# Patient Record
Sex: Female | Born: 1995 | Race: White | Hispanic: No | Marital: Single | Smoking: Never smoker
Health system: Southern US, Community
[De-identification: ages and names within clinical notes are randomized; demographics above are authoritative.]

## PROBLEM LIST (undated history)

## (undated) DIAGNOSIS — R109 Unspecified abdominal pain: Secondary | ICD-10-CM

## (undated) DIAGNOSIS — N83209 Unspecified ovarian cyst, unspecified side: Secondary | ICD-10-CM

## (undated) DIAGNOSIS — Q625 Duplication of ureter: Secondary | ICD-10-CM

## (undated) HISTORY — DX: Duplication of ureter: Q62.5

## (undated) HISTORY — PX: URETER SURGERY: SHX823

---
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
## 2017-10-07 ENCOUNTER — Other Ambulatory Visit: Payer: Self-pay

## 2017-10-07 ENCOUNTER — Encounter (HOSPITAL_COMMUNITY): Payer: Self-pay | Admitting: Emergency Medicine

## 2017-10-07 ENCOUNTER — Emergency Department (HOSPITAL_COMMUNITY)
Admission: EM | Admit: 2017-10-07 | Discharge: 2017-10-07 | Disposition: A | Discharge status: Home / Self Care | Payer: BLUE CROSS/BLUE SHIELD | Attending: Emergency Medicine | Admitting: Emergency Medicine

## 2017-10-07 DIAGNOSIS — R1030 Lower abdominal pain, unspecified: Secondary | ICD-10-CM | POA: Insufficient documentation

## 2017-10-07 DIAGNOSIS — R109 Unspecified abdominal pain: Secondary | ICD-10-CM | POA: Diagnosis present

## 2017-10-07 HISTORY — DX: Unspecified ovarian cyst, unspecified side: N83.209

## 2017-10-07 HISTORY — DX: Unspecified abdominal pain: R10.9

## 2017-10-07 LAB — URINALYSIS, ROUTINE W REFLEX MICROSCOPIC
BILIRUBIN URINE: NEGATIVE
Glucose, UA: NEGATIVE mg/dL
Hgb urine dipstick: NEGATIVE
Ketones, ur: NEGATIVE mg/dL
Leukocytes, UA: NEGATIVE
NITRITE: NEGATIVE
Protein, ur: NEGATIVE mg/dL
SPECIFIC GRAVITY, URINE: 1.019 (ref 1.005–1.030)
pH: 7 (ref 5.0–8.0)

## 2017-10-07 LAB — CBC
HCT: 35.8 % — ABNORMAL LOW (ref 36.0–46.0)
HEMOGLOBIN: 12.3 g/dL (ref 12.0–15.0)
MCH: 31.1 pg (ref 26.0–34.0)
MCHC: 34.4 g/dL (ref 30.0–36.0)
MCV: 90.4 fL (ref 78.0–100.0)
Platelets: 130 10*3/uL — ABNORMAL LOW (ref 150–400)
RBC: 3.96 MIL/uL (ref 3.87–5.11)
RDW: 12.6 % (ref 11.5–15.5)
WBC: 7 10*3/uL (ref 4.0–10.5)

## 2017-10-07 LAB — COMPREHENSIVE METABOLIC PANEL
ALK PHOS: 49 U/L (ref 38–126)
ALT: 11 U/L — ABNORMAL LOW (ref 14–54)
ANION GAP: 9 (ref 5–15)
AST: 15 U/L (ref 15–41)
Albumin: 4.1 g/dL (ref 3.5–5.0)
BILIRUBIN TOTAL: 0.2 mg/dL — AB (ref 0.3–1.2)
BUN: 11 mg/dL (ref 6–20)
CALCIUM: 9.1 mg/dL (ref 8.9–10.3)
CO2: 24 mmol/L (ref 22–32)
Chloride: 105 mmol/L (ref 101–111)
Creatinine, Ser: 0.77 mg/dL (ref 0.44–1.00)
GLUCOSE: 96 mg/dL (ref 65–99)
Potassium: 3.6 mmol/L (ref 3.5–5.1)
Sodium: 138 mmol/L (ref 135–145)
TOTAL PROTEIN: 6.4 g/dL — AB (ref 6.5–8.1)

## 2017-10-07 LAB — I-STAT BETA HCG BLOOD, ED (MC, WL, AP ONLY)

## 2017-10-07 LAB — LIPASE, BLOOD: LIPASE: 25 U/L (ref 11–51)

## 2017-10-07 MED ORDER — CYCLOBENZAPRINE HCL 10 MG PO TABS: 10.0000 mg | ORAL_TABLET | Freq: Two times a day (BID) | ORAL | 0 refills | Status: AC | PRN

## 2017-10-07 MED ORDER — IBUPROFEN 600 MG PO TABS: 600.0000 mg | ORAL_TABLET | Freq: Four times a day (QID) | ORAL | 0 refills | Status: AC | PRN

## 2017-10-07 NOTE — ED Triage Notes (Signed)
C/o generalized abd pain since 8pm with nausea.  Denies urinary complaints.  Denies constipation and diarrhea.  Reports history of abd pain 1 year ago that was diagnosed as being related to anxiety.  Also had US 1 year ago that showed likely a ruptured ovarian cyst.

## 2017-10-07 NOTE — ED Provider Notes (Signed)
[facility] Provider Note   CSN: [id number] Arrival date & time: 10/07/17  0022     History   Chief Complaint Chief Complaint  Patient presents with  . Abdominal Pain    HPI Natasha Macias is a 22 y.o. female.  HPI   22 year old female here for abd pain. Pain started 9:30 last night, 7/10, sharp and stabbing lasting 4 hrs.  Has similar pain in the past usually twice monthly.  Pain currently 2/10. Took Advil last night with minimal relief.  Muscle relaxant has helped in the past.  Pain worse with walking and standing.  Has nausea and dysuria.  No CP, SOB, vomiting, diarrhea, sweating, cough. No alcohol or tobacco use.  LMP mid march.     Past Medical History:  Diagnosis Date  . Abdominal pain   . Ovarian cyst   . Ureter, double     There are no active problems to display for this patient.   Past Surgical History:  Procedure Laterality Date  . URETER SURGERY     as infant     OB History    Gravida  0   Para  0   Term  0   Preterm  0   AB  0   Living  0     SAB  0   TAB  0   Ectopic  0   Multiple  0   Live Births  0            Home Medications    Prior to Admission medications   Not on File    Family History Family History  Problem Relation Age of Onset  . Cancer Maternal Grandfather        breast  . Cancer Mother        breast    Social History Social History   Tobacco Use  . Smoking status: Never Smoker  . Smokeless tobacco: Never Used  Substance Use Topics  . Alcohol use: No  . Drug use: No     Allergies   Patient has no known allergies.   Review of Systems Review of Systems  All other systems reviewed and are negative.    Physical Exam Updated Vital Signs BP 106/72 (BP Location: Left Arm)   Pulse 89   Temp 98.7 F (37.1 C) (Oral)   Resp 18   Ht 5\' 7"  (1.702 m)   Wt 49.9 kg (110 lb)   LMP 09/10/2017   SpO2 100%   BMI 17.23 kg/m   Physical Exam    Constitutional: She appears well-developed and well-nourished. No distress.  HENT:  Head: Atraumatic.  Eyes: Conjunctivae are normal.  Neck: Neck supple.  Cardiovascular: Normal rate and regular rhythm.  Pulmonary/Chest: Effort normal and breath sounds normal.  Abdominal: Soft. Normal appearance and bowel sounds are normal. There is tenderness in the suprapubic area.  Genitourinary:  Genitourinary Comments: Pelvic exam deferred, by patient's preference  Neurological: She is alert.  Skin: No rash noted.  Psychiatric: She has a normal mood and affect.  Nursing note and vitals reviewed.    ED Treatments / Results  Labs (all labs ordered are listed, but only abnormal results are displayed) Labs Reviewed  COMPREHENSIVE METABOLIC PANEL - Abnormal; Notable for the following components:      Result Value   Total Protein 6.4 (*)    ALT 11 (*)    Total Bilirubin 0.2 (*)    All other  components within normal limits  CBC - Abnormal; Notable for the following components:   HCT 35.8 (*)    Platelets 130 (*)    All other components within normal limits  URINALYSIS, ROUTINE W REFLEX MICROSCOPIC - Abnormal; Notable for the following components:   APPearance HAZY (*)    All other components within normal limits  LIPASE, BLOOD  I-STAT BETA HCG BLOOD, ED ([facility], [facility], [facility] ONLY)    EKG None  Radiology No results found.  Procedures Procedures (including critical care time)  Medications Ordered in ED Medications - No data to display   Initial Impression / Assessment and Plan / ED Course  I have reviewed the triage vital signs and the nursing notes.  Pertinent labs & imaging results that were available during my care of the patient were reviewed by me and considered in my medical decision making (see chart for details).     BP (!) 103/56   Pulse 65   Temp 98.7 F (37.1 C) (Oral)   Resp 18   Ht 5\' 7"  (1.702 m)   Wt 49.9 kg (110 lb)   LMP 09/10/2017   SpO2 99%   BMI 17.23 kg/m     Final Clinical Impressions(s) / ED Diagnoses   Final diagnoses:  Lower abdominal pain    ED Discharge Orders        Ordered    ibuprofen (ADVIL,MOTRIN) 600 MG tablet  Every 6 hours PRN     10/07/17 0717    cyclobenzaprine (FLEXERIL) 10 MG tablet  2 times daily PRN     10/07/17 0717     6:53 AM Patient here with complaints of low abdominal pain, initially intense but now has mostly resolved.  She has a fairly benign abdominal exam.  Mild tenderness to the suprapubic region only.  Does have history of ovarian cyst.  At this time I have low suspicion for acute emergent abdominal condition.  I did offer a pelvic examination to rule out things such as PID, ovarian torsion, tubo-ovarian abscess however patient declined.  I encourage patient to follow-up with OB/GYN for further management.  Return precautions given.  She is stable for discharge.  Patient will be discharged home with anti-inflammatory medication as well as muscle relaxant NSAIDs has helped in the past.   Fayrene Helper, PA-C 10/07/17 1478    Geoffery Lyons, MD 10/07/17 [phone]

## 2018-04-21 ENCOUNTER — Encounter (HOSPITAL_COMMUNITY): Payer: Self-pay | Admitting: Emergency Medicine

## 2018-04-21 ENCOUNTER — Ambulatory Visit (HOSPITAL_COMMUNITY)
Admission: EM | Admit: 2018-04-21 | Discharge: 2018-04-21 | Disposition: A | Discharge status: Home / Self Care | Payer: BLUE CROSS/BLUE SHIELD | Attending: Family Medicine | Admitting: Family Medicine

## 2018-04-21 DIAGNOSIS — K5900 Constipation, unspecified: Secondary | ICD-10-CM | POA: Diagnosis present

## 2018-04-21 DIAGNOSIS — R195 Other fecal abnormalities: Secondary | ICD-10-CM

## 2018-04-21 MED ORDER — ONDANSETRON 4 MG PO TBDP: 4.0000 mg | ORAL_TABLET | Freq: Three times a day (TID) | ORAL | 0 refills | Status: AC | PRN

## 2018-04-21 NOTE — Discharge Instructions (Addendum)
Please return stool sample so that we may send this off to check for parasites Once we get the results we will send it any medication needed for this You may use Zofran as needed for nausea Please continue to monitor your stool and abdominal pain, follow-up if developing blood in stool, fever, worsening abdominal pain, persistent symptoms

## 2018-04-21 NOTE — ED Triage Notes (Addendum)
Pt states "I think I have an intestinal parasite, I pooped out a worm". Pt traveled to [place] three weeks ago and ever since has felt a weird sensation near her rectum, pt states she noticed a worm in her bowel movement last night.

## 2018-04-21 NOTE — ED Provider Notes (Signed)
[place]    CSN: [id number] Arrival date & time: 04/21/18  1036     History   Chief Complaint No chief complaint on file.   HPI Natasha Macias is a 22 y.o. female no contributing past medical history presenting today for concern for parasites in stool.  Patient states that yesterday evening she had a bowel movement and believe there to be warm in her stool.  She has been concerned that she went to [place] a few weeks ago for approximately 5 days.  She went for a wedding and was on a resort, but denies going off the resort.  She has had no consistent bowel movements and has ranged from diarrhea to constipation.  Mom is concerned because over the past few months even prior to going to [place] she has had nausea, vomiting and diarrhea and fatigue.  Has occasional mild abdominal pain.  Prior to going to [place] she would also have a tickling sensation/itching sensation near her rectum at nighttime.  She notes prior to [place] that her last out of the country travel was [place] 4 years ago.  HPI  Past Medical History:  Diagnosis Date  . Abdominal pain   . Ovarian cyst   . Ureter, double     There are no active problems to display for this patient.   Past Surgical History:  Procedure Laterality Date  . URETER SURGERY     as infant    OB History    Gravida  0   Para  0   Term  0   Preterm  0   AB  0   Living  0     SAB  0   TAB  0   Ectopic  0   Multiple  0   Live Births  0            Home Medications    Prior to Admission medications   Medication Sig Start Date End Date Taking? Authorizing Provider  cyclobenzaprine (FLEXERIL) 10 MG tablet Take 1 tablet (10 mg total) by mouth 2 (two) times daily as needed for muscle spasms. 10/07/17   Domenic Moras, PA-C  ibuprofen (ADVIL,MOTRIN) 600 MG tablet Take 1 tablet (600 mg total) by mouth every 6 (six) hours as needed. 10/07/17   Domenic Moras, PA-C  ondansetron (ZOFRAN ODT) 4 MG disintegrating tablet Take 1  tablet (4 mg total) by mouth every 8 (eight) hours as needed for nausea or vomiting. 04/21/18   Keawe Marcello, Elesa Hacker, PA-C    Family History Family History  Problem Relation Age of Onset  . Cancer Maternal Grandfather        breast  . Cancer Mother        breast    Social History Social History   Tobacco Use  . Smoking status: Never Smoker  . Smokeless tobacco: Never Used  Substance Use Topics  . Alcohol use: No  . Drug use: No     Allergies   Patient has no known allergies.   Review of Systems Review of Systems  Constitutional: Positive for fatigue. Negative for activity change, appetite change and fever.  Eyes: Negative for visual disturbance.  Respiratory: Negative for shortness of breath.   Cardiovascular: Negative for chest pain.  Gastrointestinal: Positive for constipation, diarrhea and nausea. Negative for abdominal pain, blood in stool and vomiting.  Genitourinary: Negative for dysuria.  Musculoskeletal: Negative for arthralgias and joint swelling.  Skin: Positive for color change and rash. Negative for  wound.  Neurological: Negative for dizziness, weakness, light-headedness and headaches.     Physical Exam Triage Vital Signs ED Triage Vitals  Enc Vitals Group     BP 04/21/18 1045 122/66     Pulse Rate 04/21/18 1045 78     Resp 04/21/18 1045 18     Temp 04/21/18 1045 98.6 F (37 C)     Temp src --      SpO2 04/21/18 1045 100 %     Weight --      Height --      Head Circumference --      Peak Flow --      Pain Score 04/21/18 1047 0     Pain Loc --      Pain Edu? --      Excl. in [place]? --    No data found.  Updated Vital Signs BP 122/66   Pulse 78   Temp 98.6 F (37 C)   Resp 18   LMP 03/22/2018   SpO2 100%   Visual Acuity Right Eye Distance:   Left Eye Distance:   Bilateral Distance:    Right Eye Near:   Left Eye Near:    Bilateral Near:     Physical Exam  Constitutional: She appears well-developed and well-nourished. No distress.    Thin, but does not appear anorexic or underweight  HENT:  Head: Normocephalic and atraumatic.  Eyes: Conjunctivae are normal.  Neck: Neck supple.  Cardiovascular: Normal rate and regular rhythm.  No murmur heard. Pulmonary/Chest: Effort normal and breath sounds normal. No respiratory distress.  Abdominal: Soft. There is no tenderness.  Abdomen soft, nondistended, no guarding during exam, mild tenderness to central lower abdomen, no focal tenderness, negative McBurney's, negative Rovsing's, negative Murphy's.  No rebound.  Musculoskeletal: She exhibits no edema.  Neurological: She is alert.  Skin: Skin is warm and dry.  Psychiatric: She has a normal mood and affect.  Nursing note and vitals reviewed.    UC Treatments / Results  Labs (all labs ordered are listed, but only abnormal results are displayed) Labs Reviewed - No data to display  EKG None  Radiology No results found.  Procedures Procedures (including critical care time)  Medications Ordered in UC Medications - No data to display  Initial Impression / Assessment and Plan / UC Course  I have reviewed the triage vital signs and the nursing notes.  Pertinent labs & imaging results that were available during my care of the patient were reviewed by me and considered in my medical decision making (see chart for details).     Patient was concern for worms in stool, did bring specimen concerning for worm, appeared to be possible worm versus other undigestible food product.  Patient has not had consistent diarrhea.  Will obtain stool sample, patient sent home with stool kit, patient is to return this to send off for GI pathogen panel including event parasites.  Will defer treatment until results for this.  Will provide Zofran to use as needed for nausea.Discussed strict return precautions. Patient verbalized understanding and is agreeable with plan.  Final Clinical Impressions(s) / UC Diagnoses   Final diagnoses:   Abnormal findings in stool     Discharge Instructions     Please return stool sample so that we may send this off to check for parasites Once we get the results we will send it any medication needed for this You may use Zofran as needed for nausea Please continue to monitor  your stool and abdominal pain, follow-up if developing blood in stool, fever, worsening abdominal pain, persistent symptoms    ED Prescriptions    Medication Sig Dispense Auth. Provider   ondansetron (ZOFRAN ODT) 4 MG disintegrating tablet Take 1 tablet (4 mg total) by mouth every 8 (eight) hours as needed for nausea or vomiting. 20 tablet Tamerra Merkley, [place] C, PA-C     Controlled Substance Prescriptions [place] Controlled Substance Registry consulted? Not Applicable   Janith Lima, [place] 04/21/18 1253

## 2018-04-27 ENCOUNTER — Telehealth (HOSPITAL_COMMUNITY): Payer: Self-pay | Admitting: Emergency Medicine

## 2018-04-27 LAB — GASTROINTESTINAL PANEL BY PCR, STOOL (REPLACES STOOL CULTURE)
Adenovirus F40/41: NOT DETECTED
Astrovirus: NOT DETECTED
CRYPTOSPORIDIUM: NOT DETECTED
Campylobacter species: NOT DETECTED
Cyclospora cayetanensis: NOT DETECTED
E. coli O157: NOT DETECTED
ENTEROAGGREGATIVE E COLI (EAEC): NOT DETECTED
Entamoeba histolytica: NOT DETECTED
Enterotoxigenic E coli (ETEC): NOT DETECTED
GIARDIA LAMBLIA: NOT DETECTED
Norovirus GI/GII: NOT DETECTED
PLESIMONAS SHIGELLOIDES: NOT DETECTED
ROTAVIRUS A: NOT DETECTED
SALMONELLA SPECIES: NOT DETECTED
SHIGELLA/ENTEROINVASIVE E COLI (EIEC): NOT DETECTED
Sapovirus (I, II, IV, and V): NOT DETECTED
Shiga like toxin producing E coli (STEC): DETECTED — AB
VIBRIO CHOLERAE: NOT DETECTED
Vibrio species: NOT DETECTED
Yersinia enterocolitica: NOT DETECTED

## 2018-04-27 NOTE — Telephone Encounter (Signed)
Lab called with critical GI Panel results.  No results are in the system at this time.  GI is positive for Ecoli w/ Shiga-Like Toxin producing.  This was reported to Dr. Tracie Harrier.  Dr. Tracie Harrier requested the pt be called to check for improvement, blood in stool, hydration, and hygiene. The pt should be seeing improvement in her symptoms with almost full recovery within two weeks of onset of symptoms.    If pt is not improving and she is seeing blood in her stool then she needs to return to clinic for blood work and reevaluation by a provider.  Pt was called, but no answer.  A generic VM was left because the answering machine was nonspecific.  This will be forwarded to [facility], PA and Dr. Lily Peer in case further follow up is needed.

## 2018-05-30 LAB — MISCELLANEOUS TEST

## 2018-07-01 ENCOUNTER — Encounter (HOSPITAL_COMMUNITY): Payer: Self-pay

## 2018-07-01 ENCOUNTER — Other Ambulatory Visit: Payer: Self-pay

## 2018-07-01 ENCOUNTER — Observation Stay (HOSPITAL_COMMUNITY)
Admission: EM | Admit: 2018-07-01 | Discharge: 2018-07-03 | Disposition: A | Discharge status: Home / Self Care | Payer: Medicaid Other | Attending: General Surgery | Admitting: General Surgery

## 2018-07-01 DIAGNOSIS — K37 Unspecified appendicitis: Secondary | ICD-10-CM | POA: Diagnosis present

## 2018-07-01 DIAGNOSIS — R1031 Right lower quadrant pain: Secondary | ICD-10-CM | POA: Diagnosis present

## 2018-07-01 DIAGNOSIS — K3580 Unspecified acute appendicitis: Principal | ICD-10-CM | POA: Insufficient documentation

## 2018-07-01 MED ORDER — PROMETHAZINE HCL 25 MG/ML IJ SOLN
12.5000 mg | Freq: Once | INTRAMUSCULAR | Status: AC
  Administered 2018-07-01: 12.5 mg via INTRAVENOUS
  Filled 2018-07-01: qty 1

## 2018-07-01 MED ORDER — HYDROMORPHONE HCL 1 MG/ML IJ SOLN
0.5000 mg | Freq: Once | INTRAMUSCULAR | Status: AC
  Administered 2018-07-01: 0.5 mg via INTRAVENOUS
  Filled 2018-07-01: qty 1

## 2018-07-01 MED ORDER — SODIUM CHLORIDE 0.9 % IV BOLUS
1000.0000 mL | Freq: Once | INTRAVENOUS | Status: AC
  Administered 2018-07-01: 1000 mL via INTRAVENOUS

## 2018-07-01 NOTE — ED Notes (Signed)
Urine and culture sent to lab  

## 2018-07-01 NOTE — ED Provider Notes (Signed)
[facility]-EMERGENCY DEPT Provider Note   CSN: [id number] Arrival date & time: 07/01/18  2231     History   Chief Complaint Chief Complaint  Patient presents with  . Abdominal Pain    HPI Natasha Macias is a 23 y.o. female.  HPI   23 year old female with abdominal pain.  Right lower quadrant.  She states she is actually been getting this pain intermittently for about a year.  Worsening since this morning.  She predictably gets the pain midcycle.  Pain is worse when having upright posture and movement.  Associated nausea.  No vomiting.  No urinary complaints.  No unusual vaginal bleeding or discharge.  She states that she had seen her GYN previously for the same complaint but "did not like what he said" so did not follow-up further.  She also says she was seen in the emergency room previously and prescribed a muscle relaxant.  She states that she had some type of ureter surgery as a very small child.  No procedures since then.  Past Medical History:  Diagnosis Date  . Abdominal pain   . Ovarian cyst   . Ureter, double     There are no active problems to display for this patient.   Past Surgical History:  Procedure Laterality Date  . URETER SURGERY     as infant     OB History    Gravida  0   Para  0   Term  0   Preterm  0   AB  0   Living  0     SAB  0   TAB  0   Ectopic  0   Multiple  0   Live Births  0            Home Medications    Prior to Admission medications   Medication Sig Start Date End Date Taking? Authorizing Provider  cyclobenzaprine (FLEXERIL) 10 MG tablet Take 1 tablet (10 mg total) by mouth 2 (two) times daily as needed for muscle spasms. 10/07/17   Fayrene Helperran, Bowie, PA-C  ibuprofen (ADVIL,MOTRIN) 600 MG tablet Take 1 tablet (600 mg total) by mouth every 6 (six) hours as needed. 10/07/17   Fayrene Helperran, Bowie, PA-C  ondansetron (ZOFRAN ODT) 4 MG disintegrating tablet Take 1 tablet (4 mg total) by mouth every 8 (eight)  hours as needed for nausea or vomiting. 04/21/18   Wieters, Junius CreamerHallie C, PA-C    Family History Family History  Problem Relation Age of Onset  . Cancer Maternal Grandfather        breast  . Cancer Mother        breast    Social History Social History   Tobacco Use  . Smoking status: Never Smoker  . Smokeless tobacco: Never Used  Substance Use Topics  . Alcohol use: No  . Drug use: No     Allergies   Patient has no known allergies.   Review of Systems Review of Systems  All systems reviewed and negative, other than as noted in HPI.  Physical Exam Updated Vital Signs BP 128/85 (BP Location: Right Arm)   Pulse (!) 128   Temp 98.7 F (37.1 C) (Oral)   Resp 20   Wt 52.2 kg   SpO2 100%   BMI 18.01 kg/m   Physical Exam Vitals signs and nursing note reviewed.  Constitutional:      Appearance: She is well-developed.     Comments: Curled up in bed doubled  over.  Appears uncomfortable.  HENT:     Head: Normocephalic and atraumatic.  Eyes:     General:        Right eye: No discharge.        Left eye: No discharge.     Conjunctiva/sclera: Conjunctivae normal.  Neck:     Musculoskeletal: Neck supple.  Cardiovascular:     Rate and Rhythm: Normal rate and regular rhythm.     Heart sounds: Normal heart sounds. No murmur. No friction rub. No gallop.   Pulmonary:     Effort: Pulmonary effort is normal. No respiratory distress.     Breath sounds: Normal breath sounds.  Abdominal:     General: There is no distension.     Palpations: Abdomen is soft.     Tenderness: There is abdominal tenderness.     Comments: Tenderness suprapubically and in the right lower quadrant without rebound or guarding.  No distention.  Musculoskeletal:        General: No tenderness.  Skin:    General: Skin is warm and dry.  Neurological:     Mental Status: She is alert.  Psychiatric:        Behavior: Behavior normal.        Thought Content: Thought content normal.      ED Treatments  / Results  Labs (all labs ordered are listed, but only abnormal results are displayed) Labs Reviewed  COMPREHENSIVE METABOLIC PANEL - Abnormal; Notable for the following components:      Result Value   Glucose, Bld 104 (*)    All other components within normal limits  CBC - Abnormal; Notable for the following components:   WBC 12.2 (*)    Platelets 119 (*)    All other components within normal limits  URINALYSIS, ROUTINE W REFLEX MICROSCOPIC - Abnormal; Notable for the following components:   Hgb urine dipstick SMALL (*)    Ketones, ur 5 (*)    Leukocytes, UA TRACE (*)    All other components within normal limits  LIPASE, BLOOD  URINALYSIS, MICROSCOPIC (REFLEX)  I-STAT BETA HCG BLOOD, ED ([facility], [facility], [facility] ONLY)    EKG None  Radiology Ct Abdomen Pelvis W Contrast  Result Date: 07/02/2018 CLINICAL DATA:  Acute onset of right lower quadrant abdominal pain. EXAM: CT ABDOMEN AND PELVIS WITH CONTRAST TECHNIQUE: Multidetector CT imaging of the abdomen and pelvis was performed using the standard protocol following bolus administration of intravenous contrast. CONTRAST:  ISOVUE-300 IOPAMIDOL (ISOVUE-300) INJECTION 61% COMPARISON:  None. FINDINGS: Lower chest: The visualized lung bases are grossly clear. The visualized portions of the mediastinum are unremarkable. Hepatobiliary: The liver is unremarkable in appearance. The gallbladder is unremarkable in appearance. The common bile duct remains normal in caliber. Pancreas: The pancreas is within normal limits. Spleen: The spleen is unremarkable in appearance. Adrenals/Urinary Tract: The adrenal glands are unremarkable in appearance. Mild right renal scarring is noted. There is no evidence of hydronephrosis. No renal or ureteral stones are identified. No perinephric stranding is seen. Stomach/Bowel: There is dilatation of the appendix to 1.1 cm in maximal diameter, raising concern for appendicitis. Mild associated soft tissue inflammation is seen. No  significant free fluid is appreciated. There is no evidence of perforation or abscess formation at this time. The appendix is retrocecal in nature. Appendix: Location: Right lower quadrant, retrocecal in nature Diameter: 1.1 cm Appendicolith: No Mucosal hyper-enhancement: Yes Extraluminal gas: No Periappendiceal collection: No The colon is unremarkable in appearance. The small bowel is grossly  unremarkable. The stomach is unremarkable in appearance. Trace free fluid is noted within the pelvis. Vascular/Lymphatic: The abdominal aorta is unremarkable in appearance. The inferior vena cava is grossly unremarkable. No retroperitoneal lymphadenopathy is seen. No pelvic sidewall lymphadenopathy is identified. Reproductive: The bladder is mildly distended and grossly unremarkable the uterus is unremarkable in appearance. No suspicious adnexal masses are seen. Other: No additional soft tissue abnormalities are seen. Musculoskeletal: No acute osseous abnormalities are identified. The visualized musculature is unremarkable in appearance. IMPRESSION: 1. Acute appendicitis, with dilatation of the appendix to 1.1 cm in maximal diameter, and mild associated soft tissue inflammation. No evidence of perforation or abscess formation at this time. The appendix is retrocecal in nature. 2. Trace free fluid within the pelvis. 3. Mild right renal scarring. These results were called by telephone at the time of interpretation on 07/02/2018 at 1:11 am to Dr. Raeford RazorSTEPHEN Oreatha Fabry, who verbally acknowledged these results. Electronically Signed   By: Roanna RaiderJeffery  Chang M.D.   On: 07/02/2018 01:12    Procedures Procedures (including critical care time)  Medications Ordered in ED Medications  sodium chloride 0.9 % bolus 1,000 mL (1,000 mLs Intravenous New Bag/Given 07/01/18 2350)  promethazine (PHENERGAN) injection 12.5 mg (12.5 mg Intravenous Given 07/01/18 2351)  HYDROmorphone (DILAUDID) injection 0.5 mg (0.5 mg Intravenous Given 07/01/18 2354)      Initial Impression / Assessment and Plan / ED Course  I have reviewed the triage vital signs and the nursing notes.  Pertinent labs & imaging results that were available during my care of the patient were reviewed by me and considered in my medical decision making (see chart for details).     23 year old female with lower abdominal pain.  This pain has been ongoing for about a year.  Predictably midcycle.  Symptom free in the interval.  Highly likely that this is endometriosis.  Given her degree of discomfort though, will treat her symptoms and obtain some imaging.  If negative she needs to follow-up with GYN.  If she was unhappy with previous provider then she needs to find a new one.  Further care of this is simply beyond the scope of emergency medicine. We will certainly address any abnormalities in work-up if they should arise.  CT done. She actually has appendicitis. Abx. Surgery consult. Pt updated.   Final Clinical Impressions(s) / ED Diagnoses   Final diagnoses:  Acute appendicitis, unspecified acute appendicitis type    ED Discharge Orders    None       Raeford RazorKohut, Maymie Brunke, MD 07/02/18 [phone]

## 2018-07-01 NOTE — ED Triage Notes (Addendum)
Pt reports RLQ abdominal pain starting today. Nauseous, painful to stand straight up. Tachycardic. A&Ox4. Still has appendix.

## 2018-07-02 ENCOUNTER — Encounter (HOSPITAL_COMMUNITY): Payer: Self-pay

## 2018-07-02 ENCOUNTER — Emergency Department (HOSPITAL_COMMUNITY): Payer: Medicaid Other

## 2018-07-02 ENCOUNTER — Inpatient Hospital Stay (HOSPITAL_COMMUNITY): Payer: Medicaid Other | Admitting: Certified Registered"

## 2018-07-02 ENCOUNTER — Encounter (HOSPITAL_COMMUNITY): Admission: EM | Disposition: A | Discharge status: Home / Self Care | Payer: Self-pay | Source: Home / Self Care

## 2018-07-02 DIAGNOSIS — K3580 Unspecified acute appendicitis: Secondary | ICD-10-CM | POA: Diagnosis present

## 2018-07-02 DIAGNOSIS — K37 Unspecified appendicitis: Secondary | ICD-10-CM | POA: Diagnosis present

## 2018-07-02 HISTORY — PX: LAPAROSCOPIC APPENDECTOMY: SHX408

## 2018-07-02 LAB — URINALYSIS, ROUTINE W REFLEX MICROSCOPIC
Bilirubin Urine: NEGATIVE
Glucose, UA: NEGATIVE mg/dL
Ketones, ur: 5 mg/dL — AB
Nitrite: NEGATIVE
Protein, ur: NEGATIVE mg/dL
Specific Gravity, Urine: 1.016 (ref 1.005–1.030)
pH: 6 (ref 5.0–8.0)

## 2018-07-02 LAB — URINALYSIS, MICROSCOPIC (REFLEX): Bacteria, UA: NONE SEEN

## 2018-07-02 LAB — CBC
HCT: 40.4 % (ref 36.0–46.0)
Hemoglobin: 13.3 g/dL (ref 12.0–15.0)
MCH: 30.2 pg (ref 26.0–34.0)
MCHC: 32.9 g/dL (ref 30.0–36.0)
MCV: 91.6 fL (ref 80.0–100.0)
Platelets: 119 10*3/uL — ABNORMAL LOW (ref 150–400)
RBC: 4.41 MIL/uL (ref 3.87–5.11)
RDW: 12.1 % (ref 11.5–15.5)
WBC: 12.2 10*3/uL — ABNORMAL HIGH (ref 4.0–10.5)
nRBC: 0 % (ref 0.0–0.2)

## 2018-07-02 LAB — COMPREHENSIVE METABOLIC PANEL
ALT: 11 U/L (ref 0–44)
AST: 15 U/L (ref 15–41)
Albumin: 4.7 g/dL (ref 3.5–5.0)
Alkaline Phosphatase: 42 U/L (ref 38–126)
Anion gap: 9 (ref 5–15)
BUN: 15 mg/dL (ref 6–20)
CO2: 27 mmol/L (ref 22–32)
Calcium: 9.3 mg/dL (ref 8.9–10.3)
Chloride: 104 mmol/L (ref 98–111)
Creatinine, Ser: 0.78 mg/dL (ref 0.44–1.00)
GFR calc Af Amer: >60 mL/min (ref >60)
GFR calc non Af Amer: >60 mL/min (ref >60)
Glucose, Bld: 104 mg/dL — ABNORMAL HIGH (ref 70–99)
Potassium: 3.7 mmol/L (ref 3.5–5.1)
Sodium: 140 mmol/L (ref 135–145)
Total Bilirubin: 0.8 mg/dL (ref 0.3–1.2)
Total Protein: 6.9 g/dL (ref 6.5–8.1)

## 2018-07-02 LAB — LIPASE, BLOOD: Lipase: 29 U/L (ref 11–51)

## 2018-07-02 LAB — I-STAT BETA HCG BLOOD, ED (MC, WL, AP ONLY): I-stat hCG, quantitative: <5 m[IU]/mL (ref <5)

## 2018-07-02 SURGERY — APPENDECTOMY, LAPAROSCOPIC
Anesthesia: General | Site: Abdomen

## 2018-07-02 MED ORDER — SUGAMMADEX SODIUM 200 MG/2ML IV SOLN
INTRAVENOUS | Status: DC | PRN
  Administered 2018-07-02: 100 mg via INTRAVENOUS

## 2018-07-02 MED ORDER — ENOXAPARIN SODIUM 40 MG/0.4ML ~~LOC~~ SOLN
40.0000 mg | SUBCUTANEOUS | Status: DC
  Administered 2018-07-03: 40 mg via SUBCUTANEOUS
  Filled 2018-07-02: qty 0.4

## 2018-07-02 MED ORDER — KETOROLAC TROMETHAMINE 30 MG/ML IJ SOLN
INTRAMUSCULAR | Status: AC
  Filled 2018-07-02: qty 1

## 2018-07-02 MED ORDER — HYDROMORPHONE HCL 1 MG/ML IJ SOLN
INTRAMUSCULAR | Status: AC
  Filled 2018-07-02: qty 1

## 2018-07-02 MED ORDER — PROMETHAZINE HCL 25 MG/ML IJ SOLN: 6.2500 mg | INTRAMUSCULAR | Status: DC | PRN

## 2018-07-02 MED ORDER — ROCURONIUM BROMIDE 10 MG/ML (PF) SYRINGE
PREFILLED_SYRINGE | INTRAVENOUS | Status: DC | PRN
  Administered 2018-07-02: 40 mg via INTRAVENOUS

## 2018-07-02 MED ORDER — SENNOSIDES-DOCUSATE SODIUM 8.6-50 MG PO TABS
1.0000 | ORAL_TABLET | Freq: Every day | ORAL | Status: DC
  Administered 2018-07-02: 1 via ORAL
  Filled 2018-07-02: qty 1

## 2018-07-02 MED ORDER — TRAMADOL HCL 50 MG PO TABS: 50.0000 mg | ORAL_TABLET | Freq: Four times a day (QID) | ORAL | 0 refills | Status: AC | PRN

## 2018-07-02 MED ORDER — ONDANSETRON HCL 4 MG/2ML IJ SOLN
INTRAMUSCULAR | Status: DC | PRN
  Administered 2018-07-02: 4 mg via INTRAVENOUS

## 2018-07-02 MED ORDER — ACETAMINOPHEN 10 MG/ML IV SOLN
1000.0000 mg | Freq: Once | INTRAVENOUS | Status: AC
  Administered 2018-07-02: 1000 mg via INTRAVENOUS

## 2018-07-02 MED ORDER — KCL IN DEXTROSE-NACL 20-5-0.45 MEQ/L-%-% IV SOLN
INTRAVENOUS | Status: DC
  Administered 2018-07-02: 14:00:00 via INTRAVENOUS
  Filled 2018-07-02: qty 1000

## 2018-07-02 MED ORDER — FENTANYL CITRATE (PF) 100 MCG/2ML IJ SOLN
INTRAMUSCULAR | Status: AC
  Filled 2018-07-02: qty 2

## 2018-07-02 MED ORDER — DEXAMETHASONE SODIUM PHOSPHATE 10 MG/ML IJ SOLN
INTRAMUSCULAR | Status: DC | PRN
  Administered 2018-07-02: 10 mg via INTRAVENOUS

## 2018-07-02 MED ORDER — SUCCINYLCHOLINE CHLORIDE 200 MG/10ML IV SOSY
PREFILLED_SYRINGE | INTRAVENOUS | Status: AC
  Filled 2018-07-02: qty 10

## 2018-07-02 MED ORDER — BUPIVACAINE-EPINEPHRINE (PF) 0.25% -1:200000 IJ SOLN
INTRAMUSCULAR | Status: AC
  Filled 2018-07-02: qty 30

## 2018-07-02 MED ORDER — BISACODYL 10 MG RE SUPP: 10.0000 mg | Freq: Every day | RECTAL | Status: DC | PRN

## 2018-07-02 MED ORDER — MIDAZOLAM HCL 2 MG/2ML IJ SOLN
INTRAMUSCULAR | Status: AC
  Filled 2018-07-02: qty 2

## 2018-07-02 MED ORDER — ONDANSETRON HCL 4 MG/2ML IJ SOLN
4.0000 mg | Freq: Four times a day (QID) | INTRAMUSCULAR | Status: DC | PRN
  Administered 2018-07-02: 4 mg via INTRAVENOUS
  Filled 2018-07-02 (×2): qty 2

## 2018-07-02 MED ORDER — PROPOFOL 10 MG/ML IV BOLUS
INTRAVENOUS | Status: AC
  Filled 2018-07-02: qty 20

## 2018-07-02 MED ORDER — SODIUM CHLORIDE (PF) 0.9 % IJ SOLN
INTRAMUSCULAR | Status: AC
  Filled 2018-07-02: qty 50

## 2018-07-02 MED ORDER — ONDANSETRON 4 MG PO TBDP
4.0000 mg | ORAL_TABLET | Freq: Four times a day (QID) | ORAL | Status: DC | PRN
  Administered 2018-07-03: 4 mg via ORAL
  Filled 2018-07-02: qty 1

## 2018-07-02 MED ORDER — ROCURONIUM BROMIDE 10 MG/ML (PF) SYRINGE
PREFILLED_SYRINGE | INTRAVENOUS | Status: AC
  Filled 2018-07-02: qty 10

## 2018-07-02 MED ORDER — PIPERACILLIN-TAZOBACTAM 3.375 G IVPB 30 MIN
3.3750 g | Freq: Once | INTRAVENOUS | Status: AC
  Administered 2018-07-02: 3.375 g via INTRAVENOUS
  Filled 2018-07-02: qty 50

## 2018-07-02 MED ORDER — LACTATED RINGERS IR SOLN
Status: DC | PRN
  Administered 2018-07-02: 1000 mL

## 2018-07-02 MED ORDER — TRAMADOL HCL 50 MG PO TABS
50.0000 mg | ORAL_TABLET | Freq: Four times a day (QID) | ORAL | Status: DC | PRN
  Administered 2018-07-02 – 2018-07-03 (×2): 50 mg via ORAL
  Filled 2018-07-02 (×2): qty 1

## 2018-07-02 MED ORDER — FENTANYL CITRATE (PF) 100 MCG/2ML IJ SOLN
INTRAMUSCULAR | Status: DC | PRN
  Administered 2018-07-02 (×4): 50 ug via INTRAVENOUS

## 2018-07-02 MED ORDER — PROPOFOL 10 MG/ML IV BOLUS
INTRAVENOUS | Status: DC | PRN
  Administered 2018-07-02: 150 mg via INTRAVENOUS

## 2018-07-02 MED ORDER — LIDOCAINE 2% (20 MG/ML) 5 ML SYRINGE
INTRAMUSCULAR | Status: DC | PRN
  Administered 2018-07-02: 100 mg via INTRAVENOUS

## 2018-07-02 MED ORDER — MORPHINE SULFATE (PF) 2 MG/ML IV SOLN
2.0000 mg | INTRAVENOUS | Status: DC | PRN
  Administered 2018-07-02: 2 mg via INTRAVENOUS
  Administered 2018-07-02: 4 mg via INTRAVENOUS
  Administered 2018-07-02 (×2): 2 mg via INTRAVENOUS
  Filled 2018-07-02: qty 2
  Filled 2018-07-02: qty 1
  Filled 2018-07-02: qty 2
  Filled 2018-07-02: qty 1
  Filled 2018-07-02: qty 2

## 2018-07-02 MED ORDER — BUPIVACAINE-EPINEPHRINE 0.25% -1:200000 IJ SOLN
INTRAMUSCULAR | Status: DC | PRN
  Administered 2018-07-02: 30 mL

## 2018-07-02 MED ORDER — ONDANSETRON HCL 4 MG/2ML IJ SOLN
INTRAMUSCULAR | Status: AC
  Filled 2018-07-02: qty 2

## 2018-07-02 MED ORDER — MIDAZOLAM HCL 5 MG/5ML IJ SOLN
INTRAMUSCULAR | Status: DC | PRN
  Administered 2018-07-02: 2 mg via INTRAVENOUS

## 2018-07-02 MED ORDER — LACTATED RINGERS IV SOLN
INTRAVENOUS | Status: DC | PRN
  Administered 2018-07-02: 09:00:00 via INTRAVENOUS

## 2018-07-02 MED ORDER — LIDOCAINE 2% (20 MG/ML) 5 ML SYRINGE
INTRAMUSCULAR | Status: AC
  Filled 2018-07-02: qty 5

## 2018-07-02 MED ORDER — 0.9 % SODIUM CHLORIDE (POUR BTL) OPTIME
TOPICAL | Status: DC | PRN
  Administered 2018-07-02: 1000 mL

## 2018-07-02 MED ORDER — ACETAMINOPHEN 10 MG/ML IV SOLN
INTRAVENOUS | Status: AC
  Filled 2018-07-02: qty 100

## 2018-07-02 MED ORDER — MEPERIDINE HCL 50 MG/ML IJ SOLN: 6.2500 mg | INTRAMUSCULAR | Status: DC | PRN

## 2018-07-02 MED ORDER — HYDROMORPHONE HCL 1 MG/ML IJ SOLN
0.2500 mg | INTRAMUSCULAR | Status: DC | PRN
  Administered 2018-07-02 (×3): 0.5 mg via INTRAVENOUS

## 2018-07-02 MED ORDER — KETOROLAC TROMETHAMINE 30 MG/ML IJ SOLN
30.0000 mg | Freq: Once | INTRAMUSCULAR | Status: AC | PRN
  Administered 2018-07-02: 30 mg via INTRAVENOUS

## 2018-07-02 MED ORDER — IOPAMIDOL (ISOVUE-300) INJECTION 61%
100.0000 mL | Freq: Once | INTRAVENOUS | Status: AC | PRN
  Administered 2018-07-02: 100 mL via INTRAVENOUS

## 2018-07-02 MED ORDER — SIMETHICONE 80 MG PO CHEW: 40.0000 mg | CHEWABLE_TABLET | Freq: Four times a day (QID) | ORAL | Status: DC | PRN

## 2018-07-02 MED ORDER — IOPAMIDOL (ISOVUE-300) INJECTION 61%
INTRAVENOUS | Status: AC
  Filled 2018-07-02: qty 100

## 2018-07-02 MED ORDER — DEXAMETHASONE SODIUM PHOSPHATE 10 MG/ML IJ SOLN
INTRAMUSCULAR | Status: AC
  Filled 2018-07-02: qty 1

## 2018-07-02 SURGICAL SUPPLY — 40 items
APPLIER CLIP 5 13 M/L LIGAMAX5 (MISCELLANEOUS)
APPLIER CLIP ROT 10 11.4 M/L (STAPLE)
CABLE HIGH FREQUENCY MONO STRZ (ELECTRODE) ×3 IMPLANT
CHLORAPREP W/TINT 26ML (MISCELLANEOUS) ×3 IMPLANT
CLIP APPLIE 5 13 M/L LIGAMAX5 (MISCELLANEOUS) IMPLANT
CLIP APPLIE ROT 10 11.4 M/L (STAPLE) IMPLANT
COVER WAND RF STERILE (DRAPES) IMPLANT
DECANTER SPIKE VIAL GLASS SM (MISCELLANEOUS) ×3 IMPLANT
DERMABOND ADVANCED (GAUZE/BANDAGES/DRESSINGS) ×2
DERMABOND ADVANCED .7 DNX12 (GAUZE/BANDAGES/DRESSINGS) ×1 IMPLANT
DRAPE LAPAROSCOPIC ABDOMINAL (DRAPES) IMPLANT
ELECT REM PT RETURN 15FT ADLT (MISCELLANEOUS) ×3 IMPLANT
GLOVE BIO SURGEON STRL SZ 6.5 (GLOVE) ×2 IMPLANT
GLOVE BIO SURGEONS STRL SZ 6.5 (GLOVE) ×1
GLOVE BIOGEL PI IND STRL 7.0 (GLOVE) ×1 IMPLANT
GLOVE BIOGEL PI INDICATOR 7.0 (GLOVE) ×2
GOWN STRL REUS W/TWL 2XL LVL3 (GOWN DISPOSABLE) ×3 IMPLANT
GOWN STRL REUS W/TWL XL LVL3 (GOWN DISPOSABLE) ×3 IMPLANT
GRASPER SUT TROCAR 14GX15 (MISCELLANEOUS) ×3 IMPLANT
HANDLE STAPLE EGIA 4 XL (STAPLE) ×3 IMPLANT
IRRIG SUCT STRYKERFLOW 2 WTIP (MISCELLANEOUS) ×3
IRRIGATION SUCT STRKRFLW 2 WTP (MISCELLANEOUS) ×1 IMPLANT
KIT BASIN OR (CUSTOM PROCEDURE TRAY) ×3 IMPLANT
MARKER SKIN DUAL TIP RULER LAB (MISCELLANEOUS) IMPLANT
POUCH SPECIMEN RETRIEVAL 10MM (ENDOMECHANICALS) ×3 IMPLANT
RELOAD EGIA 45 MED/THCK PURPLE (STAPLE) IMPLANT
RELOAD EGIA 45 TAN VASC (STAPLE) IMPLANT
RELOAD EGIA 60 MED/THCK PURPLE (STAPLE) ×3 IMPLANT
RELOAD EGIA 60 TAN VASC (STAPLE) ×3 IMPLANT
SCISSORS LAP 5X35 DISP (ENDOMECHANICALS) ×3 IMPLANT
SLEEVE XCEL OPT CAN 5 100 (ENDOMECHANICALS) ×3 IMPLANT
SUT VIC AB 2-0 SH 27 (SUTURE)
SUT VIC AB 2-0 SH 27X BRD (SUTURE) IMPLANT
SUT VIC AB 4-0 PS2 27 (SUTURE) ×3 IMPLANT
SUT VICRYL 0 UR6 27IN ABS (SUTURE) IMPLANT
TOWEL OR 17X26 10 PK STRL BLUE (TOWEL DISPOSABLE) ×3 IMPLANT
TRAY FOLEY MTR SLVR 16FR STAT (SET/KITS/TRAYS/PACK) IMPLANT
TRAY LAPAROSCOPIC (CUSTOM PROCEDURE TRAY) ×3 IMPLANT
TROCAR BLADELESS OPT 5 100 (ENDOMECHANICALS) ×3 IMPLANT
TROCAR XCEL BLUNT TIP 100MML (ENDOMECHANICALS) ×3 IMPLANT

## 2018-07-02 NOTE — Anesthesia Procedure Notes (Signed)
Procedure Name: Intubation Date/Time: 07/02/2018 9:14 AM Performed by: Asha Grumbine D, CRNA Pre-anesthesia Checklist: Patient identified, Emergency Drugs available, Suction available and Patient being monitored Patient Re-evaluated:Patient Re-evaluated prior to induction Oxygen Delivery Method: Circle system utilized Preoxygenation: Pre-oxygenation with 100% oxygen Induction Type: IV induction Ventilation: Mask ventilation without difficulty Laryngoscope Size: Mac and 3 Grade View: Grade I Tube type: Oral Tube size: 7.5 mm Number of attempts: 1 Airway Equipment and Method: Stylet Placement Confirmation: ETT inserted through vocal cords under direct vision,  positive ETCO2 and breath sounds checked- equal and bilateral Secured at: 21 cm Tube secured with: Tape Dental Injury: Teeth and Oropharynx as per pre-operative assessment

## 2018-07-02 NOTE — H&P (Signed)
Natasha Macias is an 23 y.o. female.   Chief Complaint: RLQ pain HPI: 23 y.o. F who states she had generalized abd pain that located to the right side yesterday evening.  She is having nausea.  Denies vomiting.  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  Past Medical History:  Diagnosis Date  . Abdominal pain   . Ovarian cyst   . Ureter, double     Past Surgical History:  Procedure Laterality Date  . URETER SURGERY     as infant    Family History  Problem Relation Age of Onset  . Cancer Maternal Grandfather        breast  . Cancer Mother        breast   Social History:  reports that she has never smoked. She has never used smokeless tobacco. She reports that she does not drink alcohol or use drugs.  Allergies: No Known Allergies  (Not in a hospital admission)   Results for orders placed or performed during the hospital encounter of 07/01/18 (from the past 48 hour(s))  Lipase, blood     Status: None   Collection Time: 07/01/18 11:43 PM  Result Value Ref Range   Lipase 29 11 - 51 U/L    Comment: Performed at [facility], [address], [place], [place] [address]  Comprehensive metabolic panel     Status: Abnormal   Collection Time: 07/01/18 11:43 PM  Result Value Ref Range   Sodium 140 135 - 145 mmol/L   Potassium 3.7 3.5 - 5.1 mmol/L   Chloride 104 98 - 111 mmol/L   CO2 27 22 - 32 mmol/L   Glucose, Bld 104 (H) 70 - 99 mg/dL   BUN 15 6 - 20 mg/dL   Creatinine, Ser 3.61 0.44 - 1.00 mg/dL   Calcium 9.3 8.9 - 44.3 mg/dL   Total Protein 6.9 6.5 - 8.1 g/dL   Albumin 4.7 3.5 - 5.0 g/dL   AST 15 15 - 41 U/L   ALT 11 0 - 44 U/L   Alkaline Phosphatase 42 38 - 126 U/L   Total Bilirubin 0.8 0.3 - 1.2 mg/dL   GFR calc non Af Amer >60 >60 mL/min   GFR calc Af Amer >60 >60 mL/min   Anion gap 9 5 - 15    Comment: Performed at [facility], [address] [place], [place] [address]  CBC     Status: Abnormal   Collection  Time: 07/01/18 11:43 PM  Result Value Ref Range   WBC 12.2 (H) 4.0 - 10.5 K/uL   RBC 4.41 3.87 - 5.11 MIL/uL   Hemoglobin 13.3 12.0 - 15.0 g/dL   HCT 86.7 61.9 - 50.9 %   MCV 91.6 80.0 - 100.0 fL   MCH 30.2 26.0 - 34.0 pg   MCHC 32.9 30.0 - 36.0 g/dL   RDW 32.6 71.2 - 45.8 %   Platelets 119 (L) 150 - 400 K/uL    Comment: REPEATED TO VERIFY PLATELET COUNT CONFIRMED BY SMEAR SPECIMEN CHECKED FOR CLOTS Immature Platelet Fraction may be clinically indicated, consider ordering this additional test [id number]    nRBC 0.0 0.0 - 0.2 %    Comment: Performed at [facility], [address], [place], [place] [address]  Urinalysis, Routine w reflex microscopic     Status: Abnormal   Collection Time: 07/01/18 11:43 PM  Result Value Ref Range   Color, Urine YELLOW YELLOW  APPearance CLEAR CLEAR   Specific Gravity, Urine 1.016 1.005 - 1.030   pH 6.0 5.0 - 8.0   Glucose, UA NEGATIVE NEGATIVE mg/dL   Hgb urine dipstick SMALL (A) NEGATIVE   Bilirubin Urine NEGATIVE NEGATIVE   Ketones, ur 5 (A) NEGATIVE mg/dL   Protein, ur NEGATIVE NEGATIVE mg/dL   Nitrite NEGATIVE NEGATIVE   Leukocytes, UA TRACE (A) NEGATIVE    Comment: Performed at [facility], [address] [place], [place] [address]  Urinalysis, Microscopic (reflex)     Status: None   Collection Time: 07/01/18 11:43 PM  Result Value Ref Range   RBC / HPF 0-5 0 - 5 RBC/hpf   WBC, UA 11-20 0 - 5 WBC/hpf   Bacteria, UA NONE SEEN NONE SEEN   Squamous Epithelial / LPF 0-5 0 - 5   Urine-Other MUCOUS PRESENT     Comment: Performed at [facility], [address] [place], [place] [address]  I-Stat beta hCG blood, ED     Status: None   Collection Time: 07/01/18 11:47 PM  Result Value Ref Range   I-stat hCG, quantitative <5.0 <5 mIU/mL   Comment 3            Comment:   GEST. AGE      CONC.  (mIU/mL)   <=1 WEEK        5 - 50     2 WEEKS       50 - 500     3 WEEKS       100 -  10,000     4 WEEKS     1,000 - 30,000        FEMALE AND NON-PREGNANT FEMALE:     LESS THAN 5 mIU/mL    Ct Abdomen Pelvis W Contrast  Result Date: 07/02/2018 CLINICAL DATA:  Acute onset of right lower quadrant abdominal pain. EXAM: CT ABDOMEN AND PELVIS WITH CONTRAST TECHNIQUE: Multidetector CT imaging of the abdomen and pelvis was performed using the standard protocol following bolus administration of intravenous contrast. CONTRAST:  100mL ISOVUE-300 IOPAMIDOL (ISOVUE-300) INJECTION 61% COMPARISON:  None. FINDINGS: Lower chest: The visualized lung bases are grossly clear. The visualized portions of the mediastinum are unremarkable. Hepatobiliary: The liver is unremarkable in appearance. The gallbladder is unremarkable in appearance. The common bile duct remains normal in caliber. Pancreas: The pancreas is within normal limits. Spleen: The spleen is unremarkable in appearance. Adrenals/Urinary Tract: The adrenal glands are unremarkable in appearance. Mild right renal scarring is noted. There is no evidence of hydronephrosis. No renal or ureteral stones are identified. No perinephric stranding is seen. Stomach/Bowel: There is dilatation of the appendix to 1.1 cm in maximal diameter, raising concern for appendicitis. Mild associated soft tissue inflammation is seen. No significant free fluid is appreciated. There is no evidence of perforation or abscess formation at this time. The appendix is retrocecal in nature. Appendix: Location: Right lower quadrant, retrocecal in nature Diameter: 1.1 cm Appendicolith: No Mucosal hyper-enhancement: Yes Extraluminal gas: No Periappendiceal collection: No The colon is unremarkable in appearance. The small bowel is grossly unremarkable. The stomach is unremarkable in appearance. Trace free fluid is noted within the pelvis. Vascular/Lymphatic: The abdominal aorta is unremarkable in appearance. The inferior vena cava is grossly unremarkable. No retroperitoneal lymphadenopathy is  seen. No pelvic sidewall lymphadenopathy is identified. Reproductive: The bladder is mildly distended and grossly unremarkable the uterus is unremarkable in appearance. No suspicious adnexal masses are seen. Other: No additional soft tissue  abnormalities are seen. Musculoskeletal: No acute osseous abnormalities are identified. The visualized musculature is unremarkable in appearance. IMPRESSION: 1. Acute appendicitis, with dilatation of the appendix to 1.1 cm in maximal diameter, and mild associated soft tissue inflammation. No evidence of perforation or abscess formation at this time. The appendix is retrocecal in nature. 2. Trace free fluid within the pelvis. 3. Mild right renal scarring. These results were called by telephone at the time of interpretation on 07/02/2018 at 1:11 am to Dr. Raeford RazorSTEPHEN KOHUT, who verbally acknowledged these results. Electronically Signed   By: Roanna RaiderJeffery  Chang M.D.   On: 07/02/2018 01:12    Review of Systems  Constitutional: Negative for chills and fever.  HENT: Negative for hearing loss.   Eyes: Negative for blurred vision.  Respiratory: Negative for cough and shortness of breath.   Cardiovascular: Negative for chest pain and palpitations.  Gastrointestinal: Positive for abdominal pain and nausea. Negative for vomiting.  Genitourinary: Negative for dysuria, frequency and urgency.  Skin: Negative for itching and rash.  Neurological: Negative for dizziness and headaches.    Blood pressure 104/62, pulse 93, temperature 98.7 F (37.1 C), temperature source Oral, resp. rate 18, weight 52.2 kg, SpO2 99 %. Physical Exam  Constitutional: She is oriented to person, place, and time. She appears well-developed and well-nourished.  HENT:  Head: Normocephalic and atraumatic.  Eyes: Pupils are equal, round, and reactive to light. Conjunctivae and EOM are normal.  Neck: Normal range of motion. Neck supple.  Cardiovascular: Normal rate and regular rhythm.  Respiratory: Effort  normal. No respiratory distress.  GI: Soft. There is abdominal tenderness (RLQ).  Musculoskeletal: Normal range of motion.  Neurological: She is alert and oriented to person, place, and time.  Skin: Skin is warm and dry.     Assessment/Plan 23 y.o. F with acute appendicitis.  I have recommended laparoscopic appendectomy.  Risks include bleeding, infection, damage to adjacent structures and hernia.  I believe she understands this and agrees to proceed.  Vanita PandaAlicia C Aubrey Voong, MD 07/02/2018, 7:17 AM

## 2018-07-02 NOTE — ED Notes (Signed)
Consent form at bedside for surgeon  

## 2018-07-02 NOTE — Op Note (Signed)
Natasha Macias [id number]   PRE-OPERATIVE DIAGNOSIS:  appendicitis  POST-OPERATIVE DIAGNOSIS:  Acute appendicitis   Procedure(s): APPENDECTOMY LAPAROSCOPIC   Surgeon(s): Romie Leveehomas, Marliss Buttacavoli, MD  ASSISTANT: none   ANESTHESIA:   local and general  EBL:   20ml  Delay start of Pharmacological VTE agent (>24hrs) due to surgical blood loss or risk of bleeding:  no  DRAINS: none   SPECIMEN:  Source of Specimen:  appendix  DISPOSITION OF SPECIMEN:  PATHOLOGY  COUNTS:  YES  PLAN OF CARE: Admit for overnight observation  PATIENT DISPOSITION:  PACU - hemodynamically stable.   INDICATIONS: Patient with concerning symptoms & work up suspicious for appendicitis.  Surgery was recommended:  The anatomy & physiology of the digestive tract was discussed.  The pathophysiology of appendicitis was discussed.  Natural history risks without surgery was discussed.   I feel the risks of no intervention will lead to serious problems that outweigh the operative risks; therefore, I recommended diagnostic laparoscopy with removal of appendix to remove the pathology.  Laparoscopic & open techniques were discussed.   I noted a good likelihood this will help address the problem.    Risks such as bleeding, infection, abscess, leak, reoperation, possible ostomy, hernia, heart attack, death, and other risks were discussed.  Goals of post-operative recovery were discussed as well.  We will work to minimize complications.  Questions were answered.  The patient expresses understanding & wishes to proceed with surgery.  OR FINDINGS: acute appendicitis  DESCRIPTION:   The patient was identified & brought into the operating room. The patient was positioned supine with left arm tucked. SCDs were active during the entire case. The patient underwent general anesthesia without any difficulty.  A foley catheter was inserted under sterile conditions. The abdomen was prepped and draped in a sterile fashion. A Surgical  Timeout confirmed our plan.   I made a transverse incision through the superior umbilical fold.  I made a nick in the infraumbilical fascia and confirmed peritoneal entry.  I placed a stay suture and then the [facility].  We induced carbon dioxide insufflation.  Camera inspection revealed no injury.  I placed additional ports under direct laparoscopic visualization.  There was minimal scarring from her surgery as a baby.  I mobilized the terminal ileum to proximal ascending colon in a lateral to medial fashion.  I took care to avoid injuring any retroperitoneal structures.   I freed the appendix off its attachments to the ascending colon and cecal mesentery.  I elevated the appendix.  I was able to free off the base of the appendix, which was still viable.  I stapled the appendix off the cecum using a laparoscopic purple load stapler.  I took a healthy cuff viable cecum. I ligated the mesoappendix with a white load stapler.   I placed the appendix inside an EndoCatch bag and removed out the [facility] port.  I did copious irrigation. Hemostasis was good in the mesoappendix, colon mesentery, and retroperitoneum. Staple line was intact on the cecum with no bleeding. I washed out the pelvis, retrohepatic space and right paracolic gutter.  Hemostasis is good. There was no perforation or injury.  Because the area cleaned up well after irrigation, I did not place a drain.  I aspirated the carbon dioxide. I removed the ports. I closed the umbilical fascia site using a 0 Vicryl stitch. I closed skin using 4-0 vicryl stitch.  Sterile dressings were applied.  Patient was extubated and sent to the recovery room.  I discussed the operative findings with the patient's family. I suspect the patient is going used in the hospital at least overnight and will need antibiotics for 0 more days. Questions answered. They expressed understanding and appreciation.

## 2018-07-02 NOTE — ED Notes (Signed)
Patient transported to CT 

## 2018-07-02 NOTE — Plan of Care (Signed)
Patient received from PACU via stretcher s/p surgery. Complains of pain in abdominal area after moving from stretcher to bed in room. Will continue to monitor.

## 2018-07-02 NOTE — Discharge Instructions (Signed)

## 2018-07-02 NOTE — Anesthesia Preprocedure Evaluation (Signed)
Anesthesia Evaluation  Patient identified by MRN, date of birth, ID band Patient awake    Reviewed: Allergy & Precautions, NPO status , Patient's Chart, lab work & pertinent test results  Airway Mallampati: I       Dental no notable dental hx. (+) Teeth Intact   Pulmonary neg pulmonary ROS,    Pulmonary exam normal breath sounds clear to auscultation       Cardiovascular negative cardio ROS Normal cardiovascular exam Rhythm:Regular Rate:Normal     Neuro/Psych negative neurological ROS  negative psych ROS   GI/Hepatic negative GI ROS, Neg liver ROS,   Endo/Other  negative endocrine ROS  Renal/GU negative Renal ROS  negative genitourinary   Musculoskeletal negative musculoskeletal ROS (+)   Abdominal Normal abdominal exam  (+)   Peds  Hematology negative hematology ROS (+)   Anesthesia Other Findings   Reproductive/Obstetrics                             Anesthesia Physical Anesthesia Plan  ASA: I  Anesthesia Plan: General   Post-op Pain Management:    Induction: Intravenous  PONV Risk Score and Plan: 4 or greater and Ondansetron, Dexamethasone, Midazolam and Scopolamine patch - Pre-op  Airway Management Planned: Oral ETT  Additional Equipment:   Intra-op Plan:   Post-operative Plan: Extubation in OR  Informed Consent: I have reviewed the patients History and Physical, chart, labs and discussed the procedure including the risks, benefits and alternatives for the proposed anesthesia with the patient or authorized representative who has indicated his/her understanding and acceptance.   Dental advisory given  Plan Discussed with: CRNA  Anesthesia Plan Comments:         Anesthesia Quick Evaluation

## 2018-07-02 NOTE — Anesthesia Postprocedure Evaluation (Signed)
Anesthesia Post Note  Patient: Natasha Macias  Procedure(s) Performed: APPENDECTOMY LAPAROSCOPIC (N/A Abdomen)     Patient location during evaluation: PACU Anesthesia Type: General Level of consciousness: sedated Pain management: pain level controlled Vital Signs Assessment: post-procedure vital signs reviewed and stable Cardiovascular status: stable Postop Assessment: no apparent nausea or vomiting Anesthetic complications: no    Last Vitals:  Vitals:   07/02/18 1045 07/02/18 1100  BP: 115/63 (!) 144/83  Pulse: 89 (!) 123  Resp: 13 16  Temp:  36.5 C  SpO2: 98% 98%    Last Pain:  Vitals:   07/02/18 1143  TempSrc:   PainSc: 4    Pain Goal: Patients Stated Pain Goal: 2 (07/02/18 1113)               Caren Macadam

## 2018-07-02 NOTE — ED Notes (Signed)
ED TO INPATIENT HANDOFF REPORT  Name/Age/Gender Natasha Macias [age] y.o. female  Code Status   Home/SNF/Other Home  Chief Complaint Severe Abd. Pain (Lower Right Side)  Level of Care/Admitting Diagnosis ED Disposition    ED Disposition Condition Comment   Admit  [facility] [[id number]]  Level of Care: Med-Surg [16]  Diagnosis: Appendicitis [098119][[id number]]  Admitting Physician: Romie LeveeHOMAS, ALICIA [[id number]]  Attending Physician: Romie LeveeHOMAS, ALICIA [phone][[id number]]  Estimated length of stay: past midnight tomorrow  Certification:: I certify this patient will need inpatient services for at least 2 midnights  PT Class (Do Not Modify): Inpatient [101]  PT Acc Code (Do Not Modify): Private [1]       Medical History Past Medical History:  Diagnosis Date  . Abdominal pain   . Ovarian cyst   . Ureter, double     Allergies No Known Allergies  IV Location/Drains/Wounds Patient Lines/Drains/Airways Status   Active Line/Drains/Airways    Name:   Placement date:   Placement time:   Site:   Days:   Peripheral IV 07/01/18 Right Antecubital   07/01/18    2341    Antecubital   1          Labs/Imaging Results for orders placed or performed during the hospital encounter of 07/01/18 (from the past 48 hour(s))  Lipase, blood     Status: None   Collection Time: 07/01/18 11:43 PM  Result Value Ref Range   Lipase 29 11 - 51 U/L    Comment: Performed at [facility], [address] [place], [place] [address]  Comprehensive metabolic panel     Status: Abnormal   Collection Time: 07/01/18 11:43 PM  Result Value Ref Range   Sodium 140 135 - 145 mmol/L   Potassium 3.7 3.5 - 5.1 mmol/L   Chloride 104 98 - 111 mmol/L   CO2 27 22 - 32 mmol/L   Glucose, Bld 104 (H) 70 - 99 mg/dL   BUN 15 6 - 20 mg/dL   Creatinine, Ser 1.300.78 0.44 - 1.00 mg/dL   Calcium 9.3 8.9 - 86.510.3 mg/dL   Total Protein 6.9 6.5 - 8.1 g/dL   Albumin 4.7 3.5 - 5.0 g/dL   AST 15 15 - 41 U/L   ALT 11 0 - 44 U/L   Alkaline Phosphatase 42 38 - 126 U/L   Total Bilirubin 0.8 0.3 - 1.2 mg/dL   GFR calc non Af Amer >60 >60 mL/min   GFR calc Af Amer >60 >60 mL/min   Anion gap 9 5 - 15    Comment: Performed at [facility], [address] [place], [place] [address]  CBC     Status: Abnormal   Collection Time: 07/01/18 11:43 PM  Result Value Ref Range   WBC 12.2 (H) 4.0 - 10.5 K/uL   RBC 4.41 3.87 - 5.11 MIL/uL   Hemoglobin 13.3 12.0 - 15.0 g/dL   HCT 62.940.4 52.836.0 - 41.346.0 %   MCV 91.6 80.0 - 100.0 fL   MCH 30.2 26.0 - 34.0 pg   MCHC 32.9 30.0 - 36.0 g/dL   RDW 24.412.1 01.011.5 - 27.215.5 %   Platelets 119 (L) 150 - 400 K/uL    Comment: REPEATED TO VERIFY PLATELET COUNT CONFIRMED BY SMEAR SPECIMEN CHECKED FOR CLOTS Immature Platelet Fraction may be clinically indicated, consider ordering this additional test [id number]    nRBC 0.0 0.0 - 0.2 %    Comment: Performed at [facility],  [address], [place], [place] [address]  Urinalysis, Routine w reflex microscopic     Status: Abnormal   Collection Time: 07/01/18 11:43 PM  Result Value Ref Range   Color, Urine YELLOW YELLOW   APPearance CLEAR CLEAR   Specific Gravity, Urine 1.016 1.005 - 1.030   pH 6.0 5.0 - 8.0   Glucose, UA NEGATIVE NEGATIVE mg/dL   Hgb urine dipstick SMALL (A) NEGATIVE   Bilirubin Urine NEGATIVE NEGATIVE   Ketones, ur 5 (A) NEGATIVE mg/dL   Protein, ur NEGATIVE NEGATIVE mg/dL   Nitrite NEGATIVE NEGATIVE   Leukocytes, UA TRACE (A) NEGATIVE    Comment: Performed at [facility], [address] [place], [place] [address]  Urinalysis, Microscopic (reflex)     Status: None   Collection Time: 07/01/18 11:43 PM  Result Value Ref Range   RBC / HPF 0-5 0 - 5 RBC/hpf   WBC, UA 11-20 0 - 5 WBC/hpf   Bacteria, UA NONE SEEN NONE SEEN   Squamous Epithelial / LPF 0-5 0 - 5   Urine-Other MUCOUS PRESENT     Comment: Performed at [facility], [address] [place], [place] [address]  I-Stat beta hCG blood, ED     Status: None   Collection Time: 07/01/18 11:47 PM  Result Value Ref Range   I-stat hCG, quantitative <5.0 <5 mIU/mL   Comment 3            Comment:   GEST. AGE      CONC.  (mIU/mL)   <=1 WEEK        5 - 50     2 WEEKS       50 - 500     3 WEEKS       100 - 10,000     4 WEEKS     1,000 - 30,000        FEMALE AND NON-PREGNANT FEMALE:     LESS THAN 5 mIU/mL    Ct Abdomen Pelvis W Contrast  Result Date: 07/02/2018 CLINICAL DATA:  Acute onset of right lower quadrant abdominal pain. EXAM: CT ABDOMEN AND PELVIS WITH CONTRAST TECHNIQUE: Multidetector CT imaging of the abdomen and pelvis was performed using the standard protocol following bolus administration of intravenous contrast. CONTRAST:  ISOVUE-300 IOPAMIDOL (ISOVUE-300) INJECTION 61% COMPARISON:  None. FINDINGS: Lower chest: The visualized lung bases are grossly clear. The visualized portions of the mediastinum are unremarkable. Hepatobiliary: The liver is unremarkable in appearance. The gallbladder is unremarkable in appearance. The common bile duct remains normal in caliber. Pancreas: The pancreas is within normal limits. Spleen: The spleen is unremarkable in appearance. Adrenals/Urinary Tract: The adrenal glands are unremarkable in appearance. Mild right renal scarring is noted. There is no evidence of hydronephrosis. No renal or ureteral stones are identified. No perinephric stranding is seen. Stomach/Bowel: There is dilatation of the appendix to 1.1 cm in maximal diameter, raising concern for appendicitis. Mild associated soft tissue inflammation is seen. No significant free fluid is appreciated. There is no evidence of perforation or abscess formation at this time. The appendix is retrocecal in nature. Appendix: Location: Right lower quadrant, retrocecal in nature Diameter: 1.1 cm Appendicolith: No Mucosal hyper-enhancement: Yes Extraluminal gas: No Periappendiceal  collection: No The colon is unremarkable in appearance. The small bowel is grossly unremarkable. The stomach is unremarkable in appearance. Trace free fluid is noted within the pelvis. Vascular/Lymphatic: The abdominal aorta is unremarkable in appearance. The inferior vena cava is  grossly unremarkable. No retroperitoneal lymphadenopathy is seen. No pelvic sidewall lymphadenopathy is identified. Reproductive: The bladder is mildly distended and grossly unremarkable the uterus is unremarkable in appearance. No suspicious adnexal masses are seen. Other: No additional soft tissue abnormalities are seen. Musculoskeletal: No acute osseous abnormalities are identified. The visualized musculature is unremarkable in appearance. IMPRESSION: 1. Acute appendicitis, with dilatation of the appendix to 1.1 cm in maximal diameter, and mild associated soft tissue inflammation. No evidence of perforation or abscess formation at this time. The appendix is retrocecal in nature. 2. Trace free fluid within the pelvis. 3. Mild right renal scarring. These results were called by telephone at the time of interpretation on 07/02/2018 at 1:11 am to Dr. Raeford RazorSTEPHEN KOHUT, who verbally acknowledged these results. Electronically Signed   By: Roanna RaiderJeffery  Chang M.D.   On: 07/02/2018 01:12    Pending Labs Unresulted Labs (From admission, onward)   None      Vitals/Pain Today's Vitals   07/01/18 2303 07/01/18 2315 07/01/18 2357 07/02/18 0129  BP: 128/85  109/68 109/73  Pulse: (!) 128  93 (!) 122  Resp: 20  18 17   Temp: 98.7 F (37.1 C)     TempSrc: Oral     SpO2: 100%  97% 99%  Weight: 52.2 kg     PainSc:  10-Worst pain ever      Isolation Precautions No active isolations  Medications Medications  iopamidol (ISOVUE-300) 61 % injection (has no administration in time range)  sodium chloride (PF) 0.9 % injection (has no administration in time range)  piperacillin-tazobactam (ZOSYN) IVPB 3.375 g (3.375 g Intravenous New Bag/Given  07/02/18 0128)  sodium chloride 0.9 % bolus 1,000 mL (0 mLs Intravenous Stopped 07/02/18 0128)  promethazine (PHENERGAN) injection 12.5 mg (12.5 mg Intravenous Given 07/01/18 2351)  HYDROmorphone (DILAUDID) injection 0.5 mg (0.5 mg Intravenous Given 07/01/18 2354)  iopamidol (ISOVUE-300) 61 % injection 100 mL (100 mLs Intravenous Contrast Given 07/02/18 0042)    Mobility walks

## 2018-07-02 NOTE — Transfer of Care (Signed)
Immediate Anesthesia Transfer of Care Note  Patient: Natasha Macias  Procedure(s) Performed: APPENDECTOMY LAPAROSCOPIC (N/A Abdomen)  Patient Location: PACU  Anesthesia Type:General  Level of Consciousness: awake, alert  and oriented  Airway & Oxygen Therapy: Patient Spontanous Breathing and Patient connected to face mask oxygen  Post-op Assessment: Report given to RN and Post -op Vital signs reviewed and stable  Post vital signs: Reviewed and stable  Last Vitals:  Vitals Value Taken Time  BP    Temp    Pulse 124 07/02/2018 10:16 AM  Resp 9 07/02/2018 10:16 AM  SpO2 100 % 07/02/2018 10:16 AM  Vitals shown include unvalidated device data.  Last Pain:  Vitals:   07/02/18 0625  TempSrc:   PainSc: Asleep         Complications: No apparent anesthesia complications

## 2018-07-03 ENCOUNTER — Encounter (HOSPITAL_COMMUNITY): Payer: Self-pay | Admitting: General Surgery

## 2018-07-03 NOTE — Progress Notes (Signed)
Patient ID: Natasha Macias, female   DOB: 08/01/1995, 23 y.o.   MRN: [id number]  Doing better Pain controlled Tolerating po Abdomen soft  Plan: Discharge home

## 2018-07-03 NOTE — Progress Notes (Signed)
Nurse reviewed discharge instructions with pt.  Pt verbalized understanding of discharge instructions, follow up appointment and new medication.  Prescription sent electronically to pt's pharmacy.

## 2018-07-03 NOTE — Discharge Summary (Signed)
Physician Discharge Summary  Patient ID: Natasha Macias MRN: [id number] DOB/AGE: Dec 01, 1995 23 y.o.  Admit date: 07/01/2018 Discharge date: 07/03/2018  Admission Diagnoses:  Discharge Diagnoses:  Active Problems:   Appendicitis   Acute appendicitis   Discharged Condition: good  Hospital Course: uneventful post op recovery.  Discharged home POD#1  Consults: None  Significant Diagnostic Studies: labs:    Treatments: surgery: laparoscopic appendectomy  Discharge Exam: Blood pressure (!) 105/59, pulse 96, temperature 98.4 F (36.9 C), temperature source Oral, resp. rate 16, height 5\' 8"  (1.727 m), weight 52.2 kg, SpO2 100 %. General appearance: alert, cooperative and no distress Resp: clear to auscultation bilaterally Cardio: regular rate and rhythm, S1, S2 normal, no murmur, click, rub or gallop Incision/Wound:abdomen soft, incisions clean  Disposition: Discharge disposition: 01-Home or Self Care        Allergies as of 07/03/2018   No Known Allergies     Medication List    TAKE these medications   cyclobenzaprine 10 MG tablet Commonly known as:  FLEXERIL Take 1 tablet (10 mg total) by mouth 2 (two) times daily as needed for muscle spasms.   ibuprofen 600 MG tablet Commonly known as:  ADVIL,MOTRIN Take 1 tablet (600 mg total) by mouth every 6 (six) hours as needed.   ondansetron 4 MG disintegrating tablet Commonly known as:  ZOFRAN ODT Take 1 tablet (4 mg total) by mouth every 8 (eight) hours as needed for nausea or vomiting.   traMADol 50 MG tablet Commonly known as:  ULTRAM Take 1 tablet (50 mg total) by mouth every 6 (six) hours as needed for moderate pain.      Follow-up Information    [facility], [place]. Schedule an appointment as soon as possible for a visit in 2 weeks.   Specialty:  General Surgery Contact information: [address] [place] [address] [phone]          Signed: Abigail Miyamoto 07/03/2018, 8:19 AM

## 2020-05-29 IMAGING — CT CT ABD-PELV W/ CM
2 of 4 series · 15 of 46 positions shown, 17 images · IV contrast (ISOVUE)
Comparison: None.

CLINICAL DATA: Acute onset of right lower quadrant abdominal pain.

EXAM:
CT ABDOMEN AND PELVIS WITH CONTRAST
TECHNIQUE: Multidetector CT imaging of the abdomen and pelvis was performed
using the standard protocol following bolus administration of
intravenous contrast.
CONTRAST:  100mL RAOZA5-2DD IOPAMIDOL (RAOZA5-2DD) INJECTION 61%

[Series 2: axial st · axial · 0.77mm/px · z∈[+1056,+1456]mm · 12 of 90 slices shown, 14 images]
[im 5/90  soft-tissue]
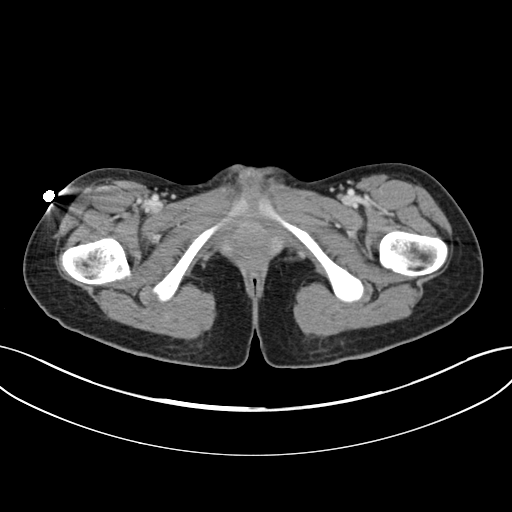
[im 5/90  bone]
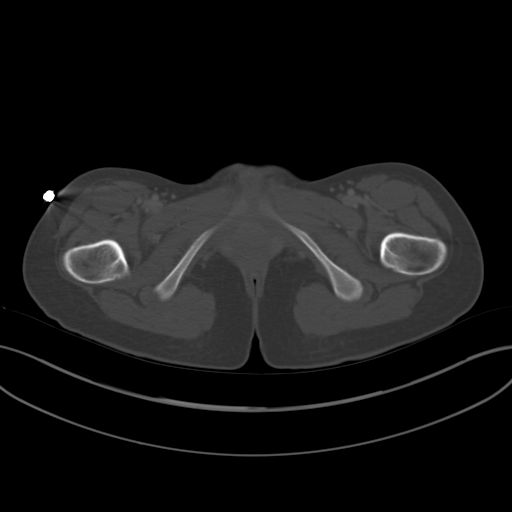
[im 15/90  soft-tissue]
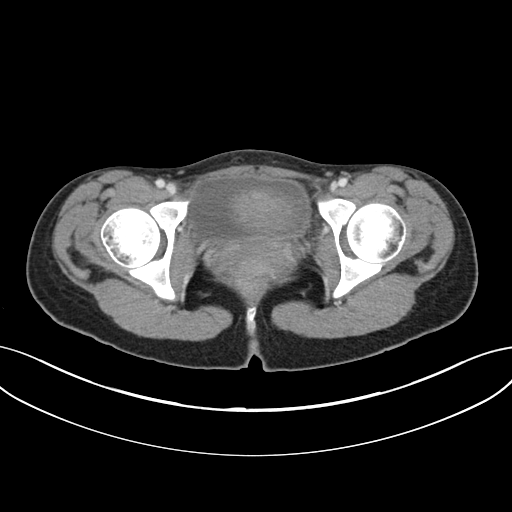
[im 19/90  soft-tissue]
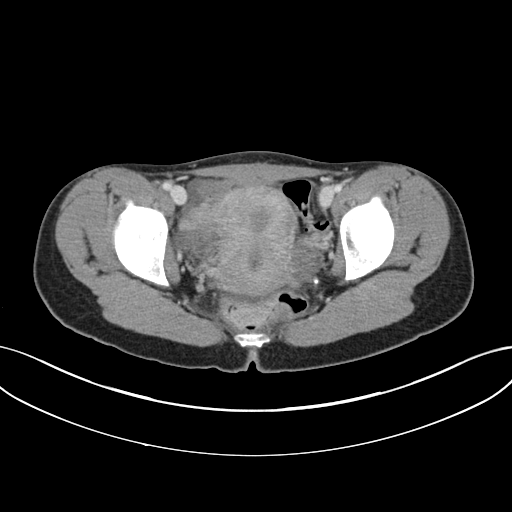
[im 29/90  soft-tissue]
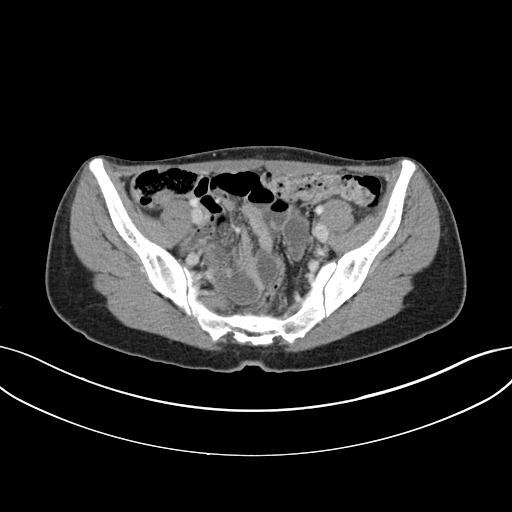
[im 33/90  soft-tissue]
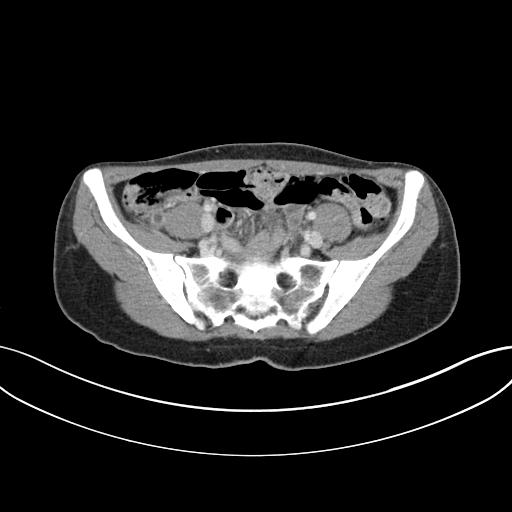
[im 43/90  soft-tissue]
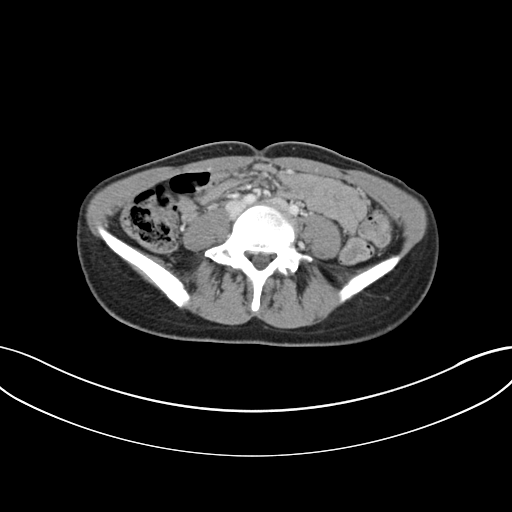
[im 47/90  soft-tissue]
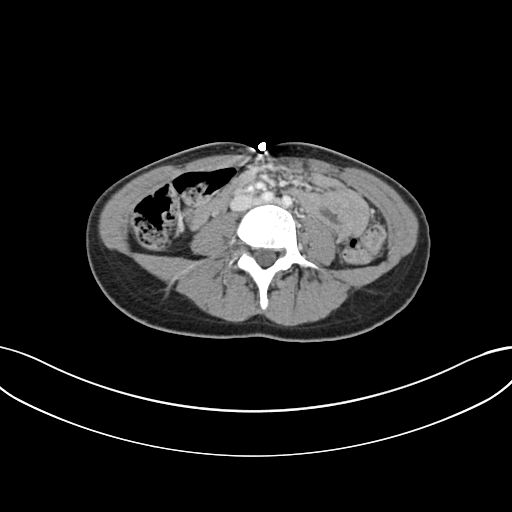
[im 57/90  soft-tissue]
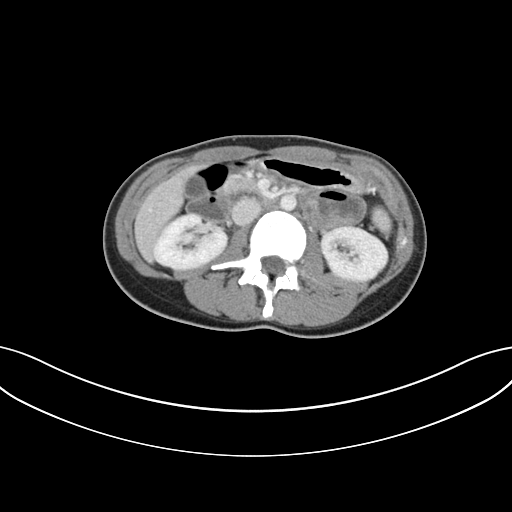
[im 61/90  soft-tissue]
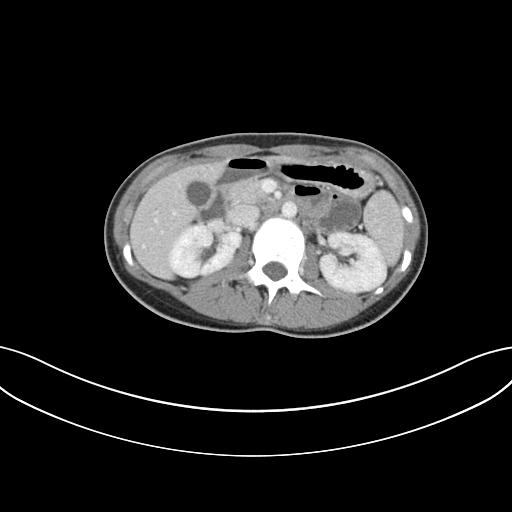
[im 61/90  bone]
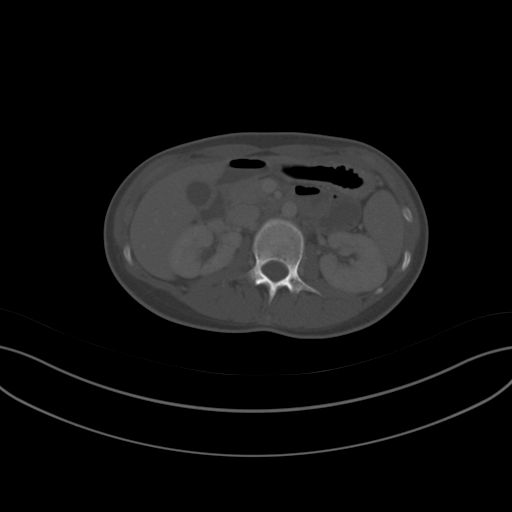
[im 71/90  soft-tissue]
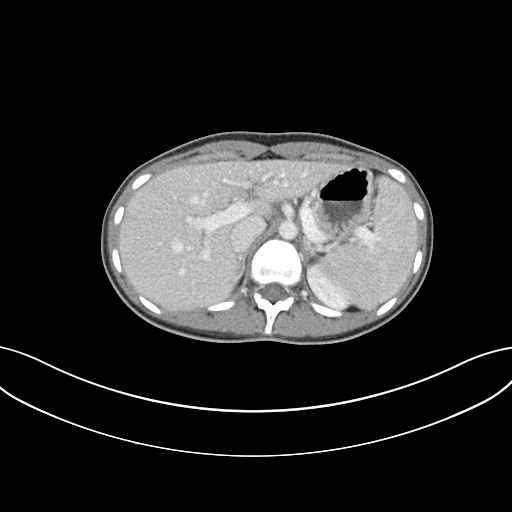
[im 75/90  soft-tissue]
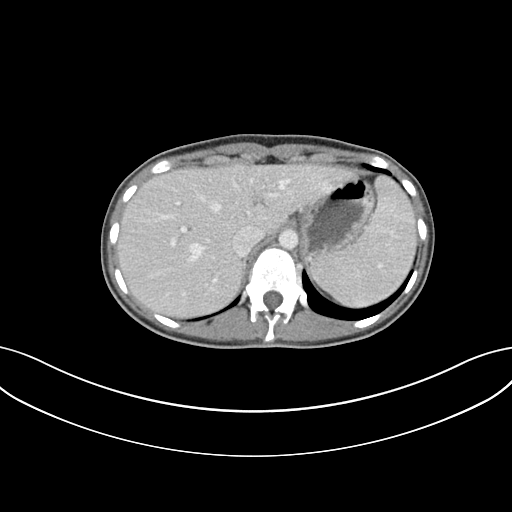
[im 85/90  soft-tissue]
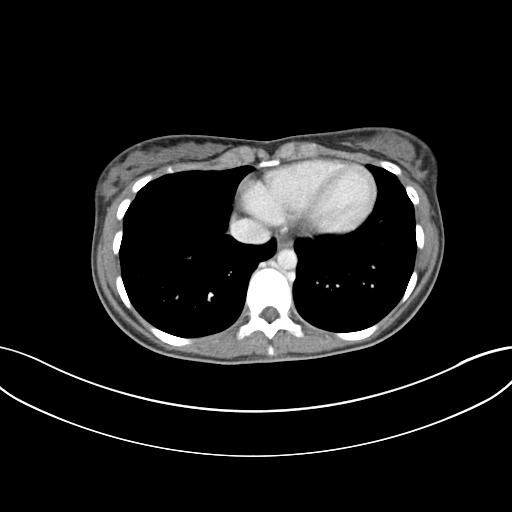

[Series 4: coronal st · coronal · 0.58mm/px · 3 of 82 slices shown]
[im 28/82  soft-tissue]
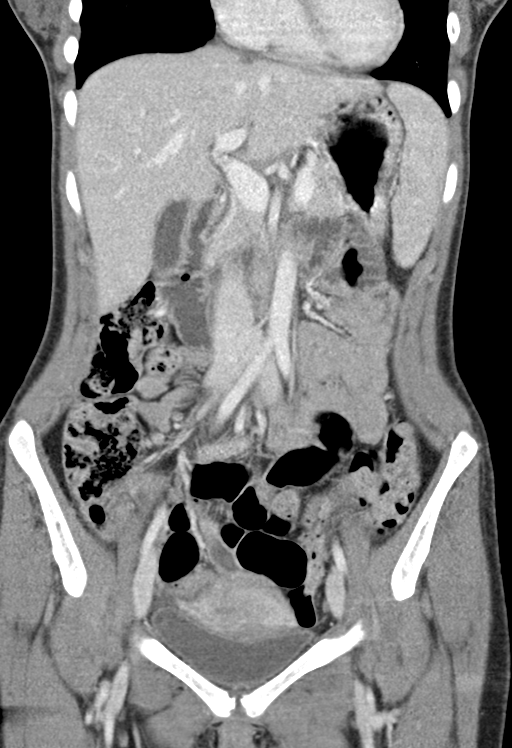
[im 37/82  soft-tissue]
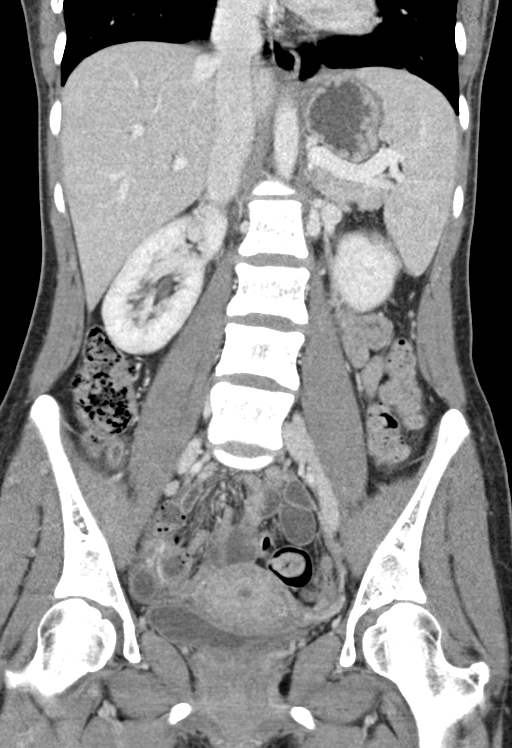
[im 46/82  soft-tissue]
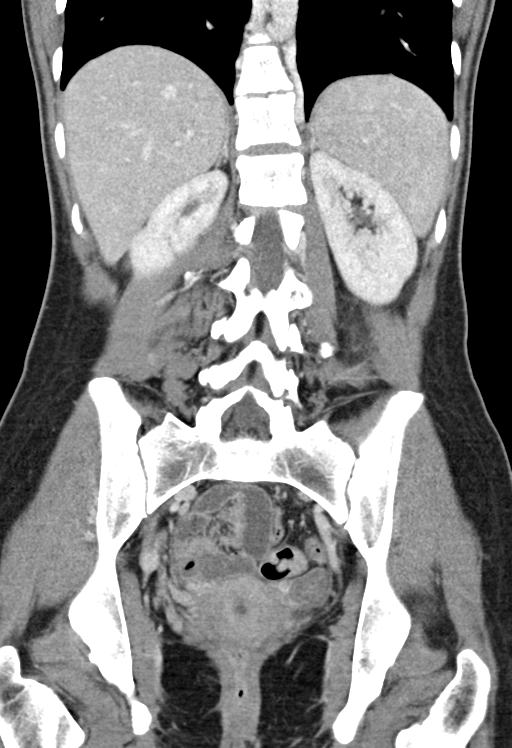

[15 of 46 positions shown; findings below may reference images not displayed]

FINDINGS: Lower chest: The visualized lung bases are grossly clear. The
visualized portions of the mediastinum are unremarkable.

Hepatobiliary: The liver is unremarkable in appearance. The
gallbladder is unremarkable in appearance. The common bile duct
remains normal in caliber.

Pancreas: The pancreas is within normal limits.

Spleen: The spleen is unremarkable in appearance.

Adrenals/Urinary Tract: The adrenal glands are unremarkable in
appearance. Mild right renal scarring is noted. There is no evidence
of hydronephrosis. No renal or ureteral stones are identified. No
perinephric stranding is seen.

Stomach/Bowel: There is dilatation of the appendix to 1.1 cm in
maximal diameter, raising concern for appendicitis. Mild associated
soft tissue inflammation is seen. No significant free fluid is
appreciated. There is no evidence of perforation or abscess
formation at this time. The appendix is retrocecal in nature.

Appendix: Location: Right lower quadrant, retrocecal in nature

Diameter: 1.1 cm

Appendicolith: No

Mucosal hyper-enhancement: Yes

Extraluminal gas: No

Periappendiceal collection: No

The colon is unremarkable in appearance. The small bowel is grossly
unremarkable. The stomach is unremarkable in appearance.

Trace free fluid is noted within the pelvis.

Vascular/Lymphatic: The abdominal aorta is unremarkable in
appearance. The inferior vena cava is grossly unremarkable. No
retroperitoneal lymphadenopathy is seen. No pelvic sidewall
lymphadenopathy is identified.

Reproductive: The bladder is mildly distended and grossly
unremarkable the uterus is unremarkable in appearance. No suspicious
adnexal masses are seen.

Other: No additional soft tissue abnormalities are seen.

Musculoskeletal: No acute osseous abnormalities are identified. The
visualized musculature is unremarkable in appearance.
IMPRESSION: 1. Acute appendicitis, with dilatation of the appendix to 1.1 cm in
maximal diameter, and mild associated soft tissue inflammation. No
evidence of perforation or abscess formation at this time. The
appendix is retrocecal in nature.
2. Trace free fluid within the pelvis.
3. Mild right renal scarring.

These results were called by telephone at the time of interpretation
on 07/02/2018 at [DATE] to Dr. SERGO SHEEHAN, who verbally
acknowledged these results.
